# Patient Record
Sex: Female | Born: 1965 | Race: White | Hispanic: No | Marital: Married | Smoking: Current every day smoker
Health system: Southern US, Community
[De-identification: ages and names within clinical notes are randomized; demographics above are authoritative.]

## PROBLEM LIST (undated history)

## (undated) DIAGNOSIS — J449 Chronic obstructive pulmonary disease, unspecified: Secondary | ICD-10-CM

## (undated) DIAGNOSIS — Q600 Renal agenesis, unilateral: Secondary | ICD-10-CM

## (undated) HISTORY — PX: JOINT REPLACEMENT: SHX530

## (undated) HISTORY — PX: ABDOMINAL HYSTERECTOMY: SHX81

## (undated) HISTORY — PX: CHOLECYSTECTOMY: SHX55

---

## 2005-08-16 ENCOUNTER — Emergency Department: Payer: Self-pay | Admitting: Emergency Medicine

## 2005-12-08 ENCOUNTER — Ambulatory Visit: Payer: Self-pay | Admitting: *Deleted

## 2006-07-05 ENCOUNTER — Emergency Department: Payer: Self-pay | Admitting: Emergency Medicine

## 2006-08-02 ENCOUNTER — Ambulatory Visit: Payer: Self-pay | Admitting: *Deleted

## 2007-01-05 ENCOUNTER — Other Ambulatory Visit: Payer: Self-pay

## 2007-01-05 ENCOUNTER — Emergency Department: Payer: Self-pay | Admitting: Emergency Medicine

## 2007-01-11 ENCOUNTER — Other Ambulatory Visit: Payer: Self-pay

## 2007-01-11 ENCOUNTER — Emergency Department: Payer: Self-pay | Admitting: Emergency Medicine

## 2007-01-14 ENCOUNTER — Ambulatory Visit: Payer: Self-pay | Admitting: *Deleted

## 2007-01-19 ENCOUNTER — Ambulatory Visit: Payer: Self-pay | Admitting: *Deleted

## 2007-06-11 ENCOUNTER — Emergency Department: Payer: Self-pay | Admitting: Emergency Medicine

## 2007-11-10 IMAGING — RF DG UGI W/ KUB
1 series · 15 of 15 positions shown · non-contrast
Comparison: none

REASON FOR EXAM: Epigastric pain, difficulty swallowing, evaluate
esophagus, rule out ulcer
COMMENTS:

[Series 1: run · 15 of 15 slices shown]
[im 1/15]
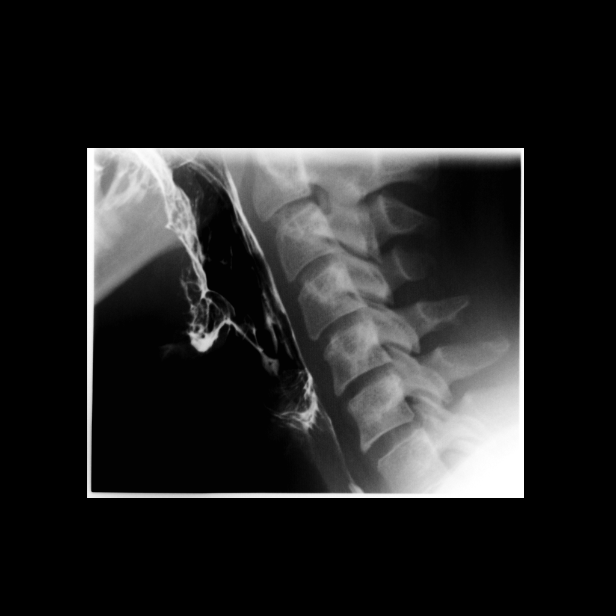
[im 2/15]
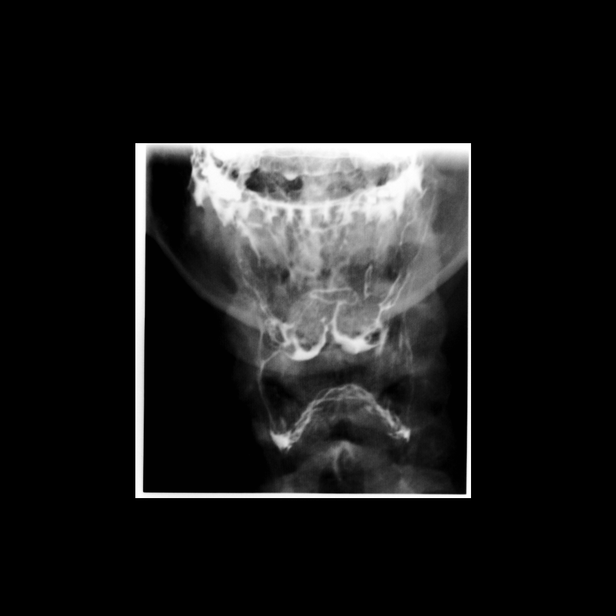
[im 3/15]
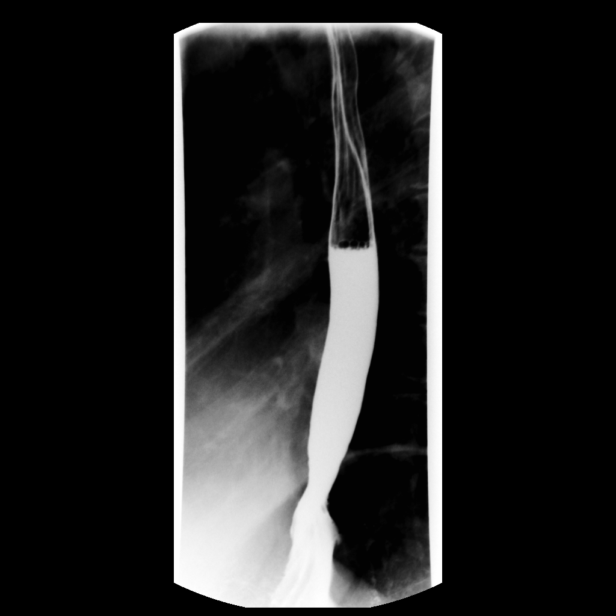
[im 4/15]
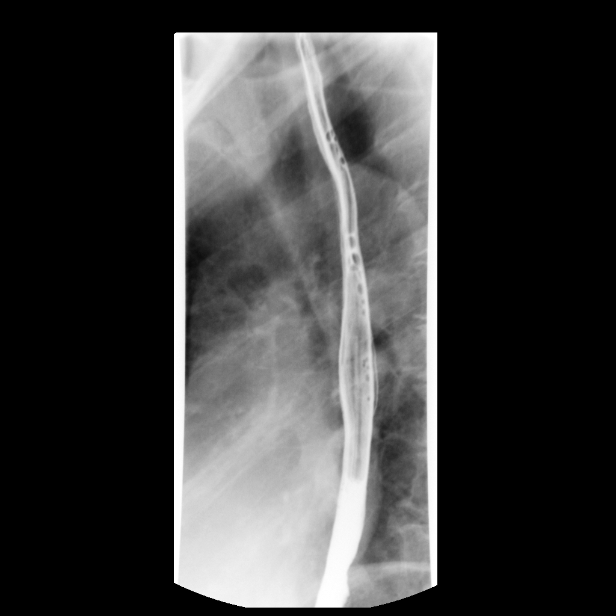
[im 5/15]
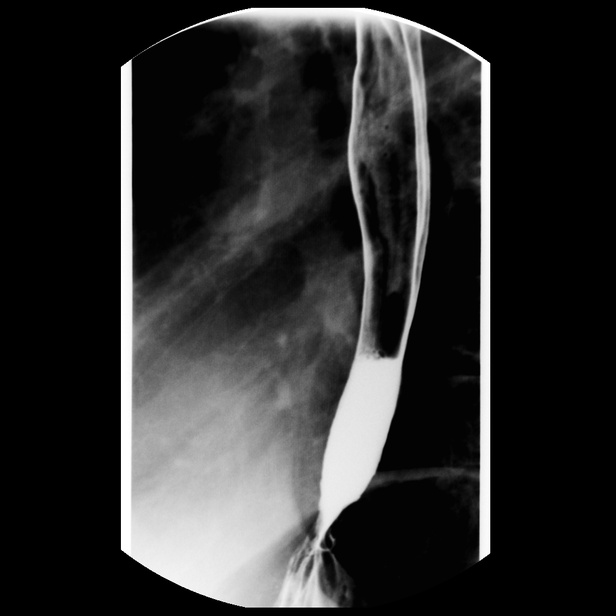
[im 6/15]
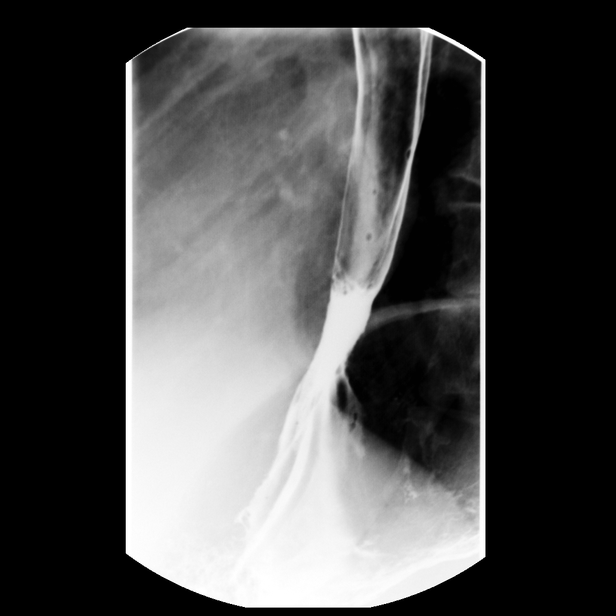
[im 7/15]
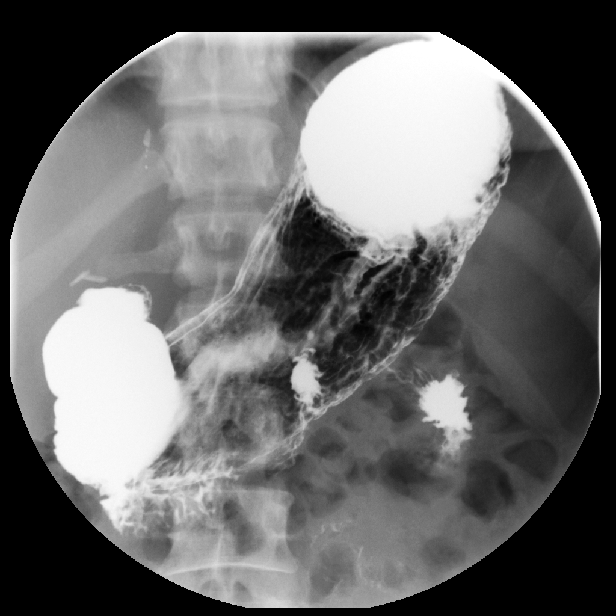
[im 8/15]
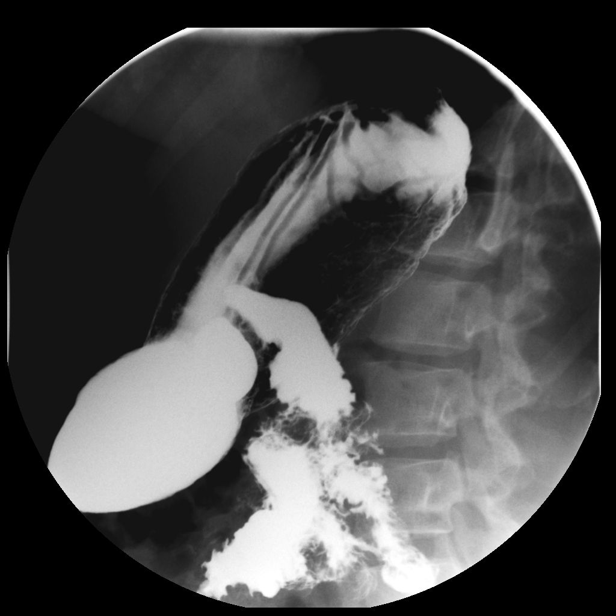
[im 9/15]
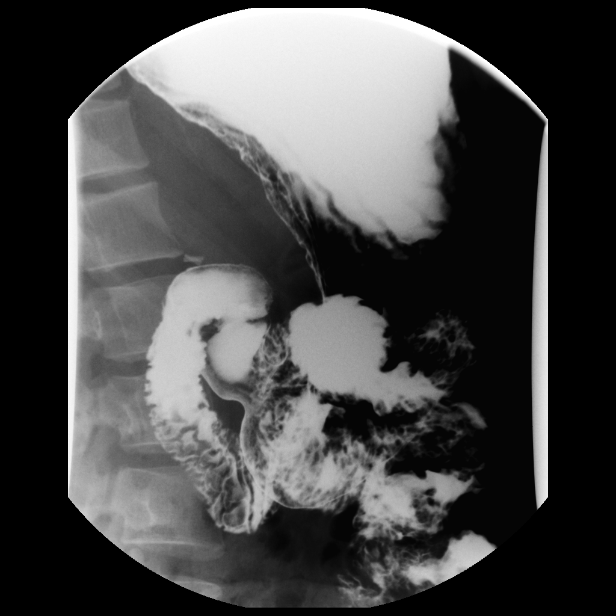
[im 10/15]
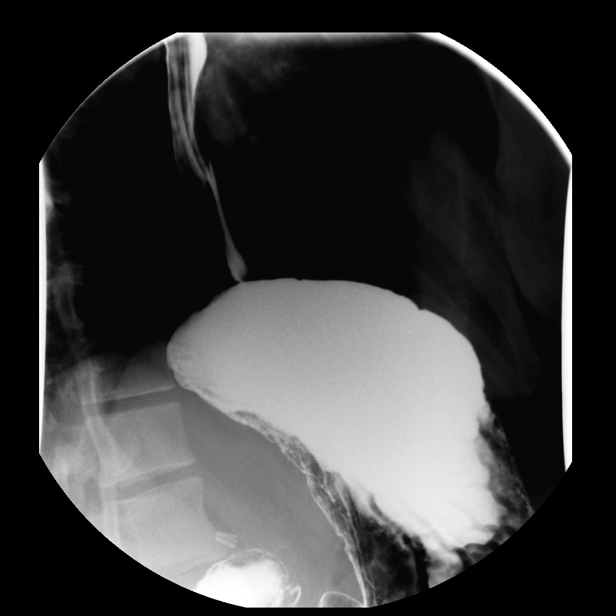
[im 11/15]
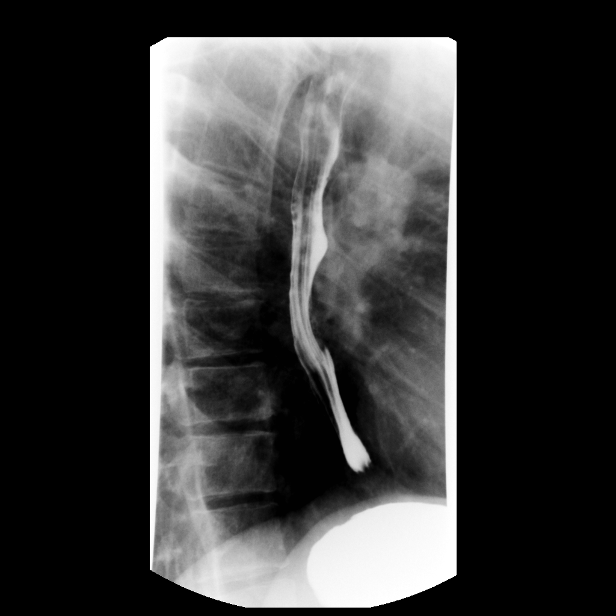
[im 12/15]
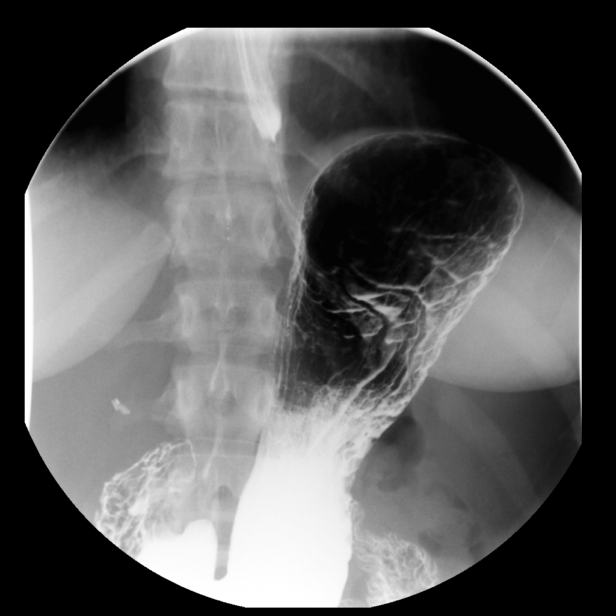
[im 13/15]
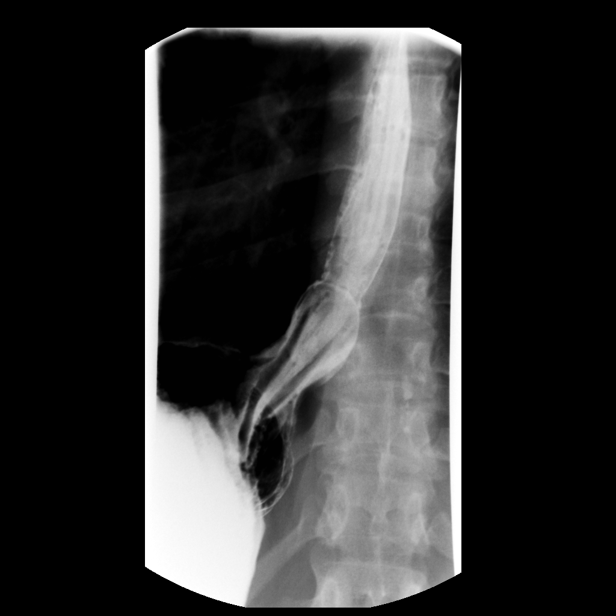
[im 14/15]
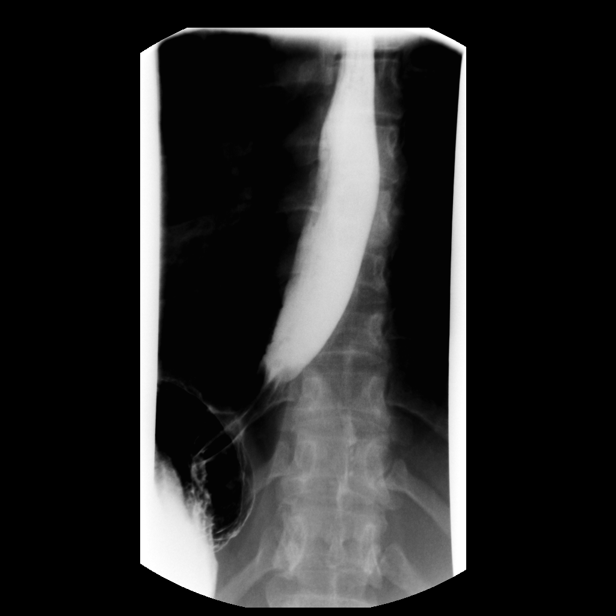
[im 15/15]
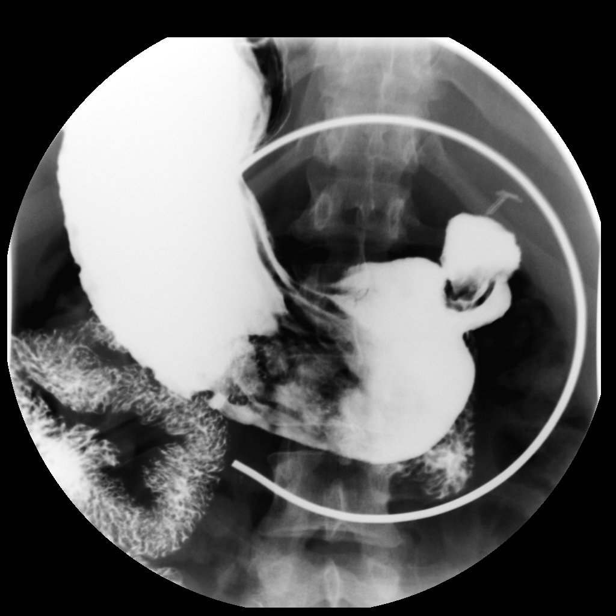

[15 of 15 positions shown; findings below may reference images not displayed]

PROCEDURE:  UPPER GI W/BARIUM SWALLOW  -  January 19, 2007 [DATE]

RESULT:     The patient reports epigastric discomfort as well as difficulty
swallowing.

The cervical esophagus distended well. There was no laryngeal penetration of
the barium. The thoracic esophagus also distended well. There was a moderate
amount of gastroesophageal reflux seen which did occur spontaneously. No
hiatal hernia was demonstrated. The stomach distended well. Gastric emptying
appeared normal. The gastric and duodenal mucosal patterns were normal in
appearance. The 12.0 mm, barium pill passed without difficulty.
IMPRESSION: 1.  I do not see acute abnormality of the cervical or thoracic esophagus. A
very subtle, reducible, hiatal hernia may be present. There is a moderate
amount of spontaneous gastroesophageal reflux without esophagitis. The
patient did note that she experienced some discomfort in the cervical region
with swallowing maneuvers. Direct visualization may be of value.
2.  The stomach and duodenum are normal in appearance. The 12.0 mm barium
pill passed without difficulty.

## 2008-03-21 ENCOUNTER — Other Ambulatory Visit: Payer: Self-pay

## 2008-03-21 ENCOUNTER — Emergency Department: Payer: Self-pay | Admitting: Emergency Medicine

## 2008-07-24 ENCOUNTER — Other Ambulatory Visit: Payer: Self-pay

## 2008-07-24 ENCOUNTER — Emergency Department: Payer: Self-pay | Admitting: Internal Medicine

## 2008-08-02 ENCOUNTER — Ambulatory Visit: Payer: Self-pay | Admitting: Gastroenterology

## 2008-09-03 ENCOUNTER — Emergency Department: Payer: Self-pay | Admitting: Emergency Medicine

## 2009-01-21 ENCOUNTER — Ambulatory Visit: Payer: Self-pay | Admitting: Family Medicine

## 2009-07-04 ENCOUNTER — Ambulatory Visit: Payer: Self-pay | Admitting: Family Medicine

## 2009-07-10 ENCOUNTER — Emergency Department: Payer: Self-pay | Admitting: Emergency Medicine

## 2009-07-12 ENCOUNTER — Ambulatory Visit: Payer: Self-pay | Admitting: Family Medicine

## 2009-12-23 ENCOUNTER — Ambulatory Visit: Payer: Self-pay | Admitting: Family Medicine

## 2010-10-16 ENCOUNTER — Emergency Department: Payer: Self-pay | Admitting: Emergency Medicine

## 2011-04-09 ENCOUNTER — Emergency Department: Payer: Self-pay | Admitting: Emergency Medicine

## 2011-05-19 ENCOUNTER — Emergency Department: Payer: Self-pay | Admitting: Unknown Physician Specialty

## 2011-08-03 ENCOUNTER — Emergency Department: Payer: Self-pay | Admitting: *Deleted

## 2011-09-04 ENCOUNTER — Ambulatory Visit: Payer: Self-pay | Admitting: Family Medicine

## 2011-09-20 ENCOUNTER — Emergency Department: Payer: Self-pay | Admitting: Internal Medicine

## 2012-03-01 ENCOUNTER — Emergency Department: Payer: Self-pay | Admitting: Emergency Medicine

## 2012-03-22 ENCOUNTER — Emergency Department: Payer: Self-pay | Admitting: Emergency Medicine

## 2012-08-20 ENCOUNTER — Emergency Department: Payer: Self-pay | Admitting: Emergency Medicine

## 2012-10-16 ENCOUNTER — Emergency Department: Payer: Self-pay | Admitting: Emergency Medicine

## 2012-10-16 LAB — BASIC METABOLIC PANEL
Anion Gap: 8 (ref 7–16)
Calcium, Total: 9.2 mg/dL (ref 8.5–10.1)
Chloride: 105 mmol/L (ref 98–107)
Co2: 24 mmol/L (ref 21–32)
Osmolality: 270 (ref 275–301)
Sodium: 137 mmol/L (ref 136–145)

## 2012-10-16 LAB — CBC
HGB: 14.1 g/dL (ref 12.0–16.0)
MCV: 94 fL (ref 80–100)
Platelet: 279 10*3/uL (ref 150–440)
RDW: 13.5 % (ref 11.5–14.5)
WBC: 4.9 10*3/uL (ref 3.6–11.0)

## 2012-10-16 LAB — TROPONIN I: Troponin-I: <0.02 ng/mL

## 2013-11-01 ENCOUNTER — Emergency Department: Payer: Self-pay | Admitting: Emergency Medicine

## 2013-11-11 ENCOUNTER — Emergency Department: Payer: Self-pay | Admitting: Emergency Medicine

## 2014-05-18 ENCOUNTER — Emergency Department: Payer: Self-pay | Admitting: Emergency Medicine

## 2014-06-06 ENCOUNTER — Emergency Department: Payer: Self-pay | Admitting: Emergency Medicine

## 2015-02-01 ENCOUNTER — Ambulatory Visit: Payer: Self-pay

## 2015-03-28 IMAGING — US US EXTREM NON VASC*R* COMPLETE
1 series · 10 of 10 positions shown · non-contrast
Comparison: CT examination 08/02/2008

CLINICAL DATA: Right groin pain.  Evaluate for hernia.

EXAM:
US EXTREM NON VASC*R* COMPLETE (ULTRASOUND OF THE RIGHT GROIN)
TECHNIQUE: Images were obtained in the right groin.

[Series 1: us extrem non vasc*right* complete · 0.06mm/px · 10 acquisitions, 10 frames shown]
[im 1/10]
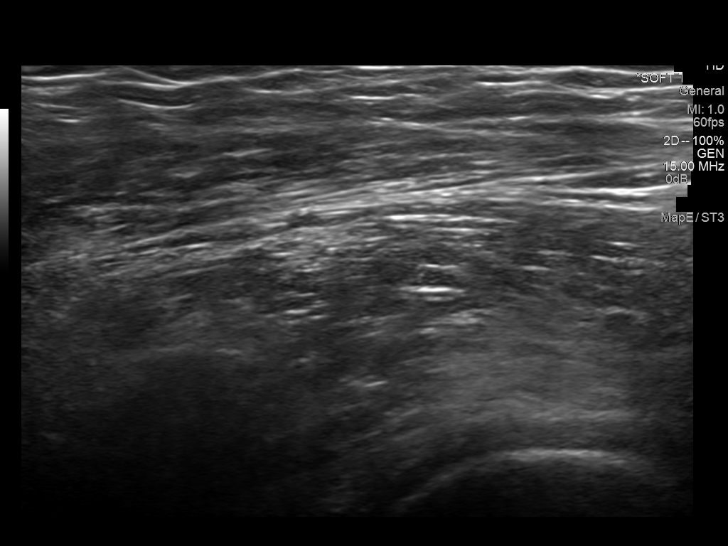
[im 2/10]
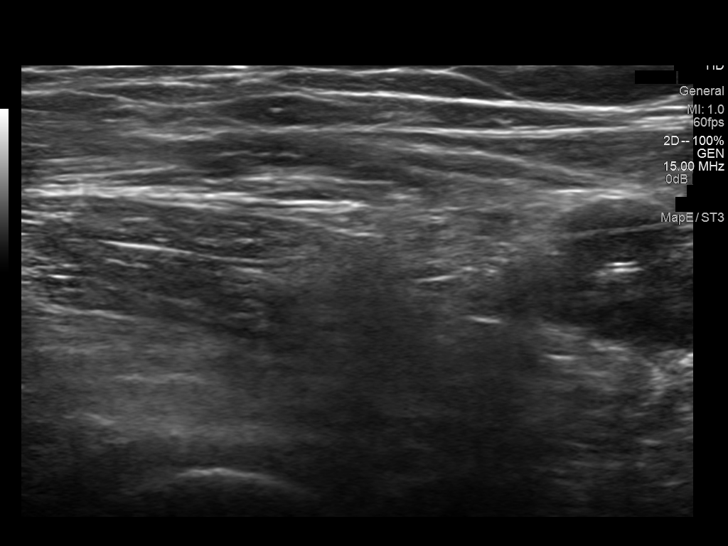
[im 3/10]
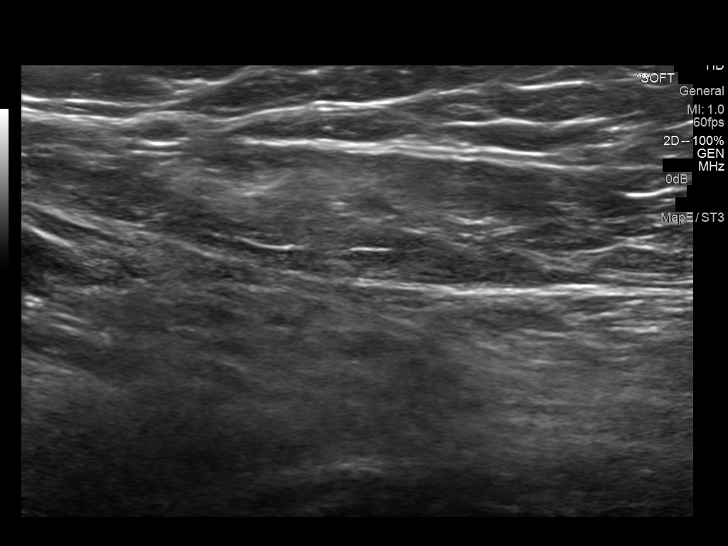
[im 4/10]
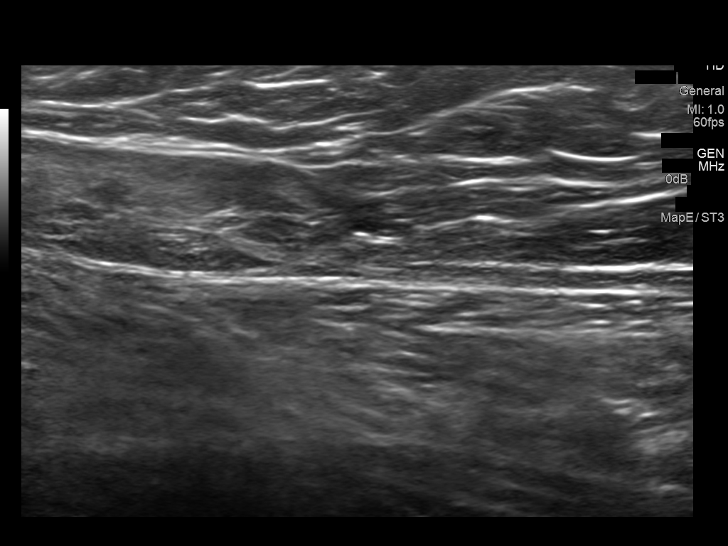
[im 5/10]
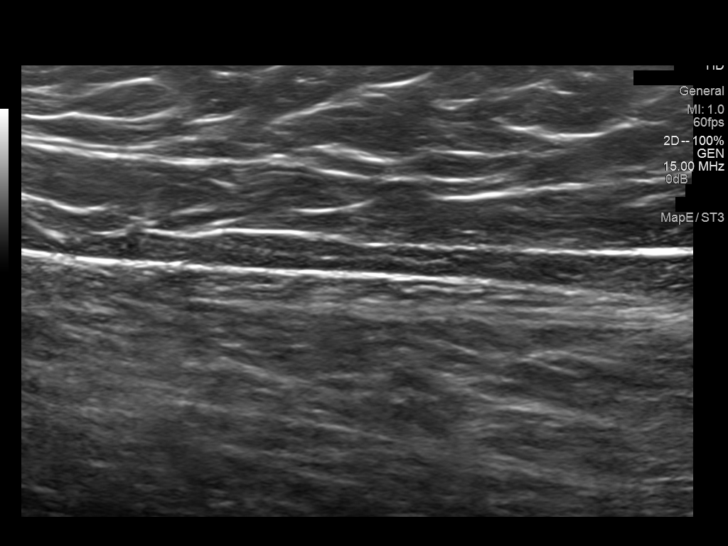
[im 6/10]
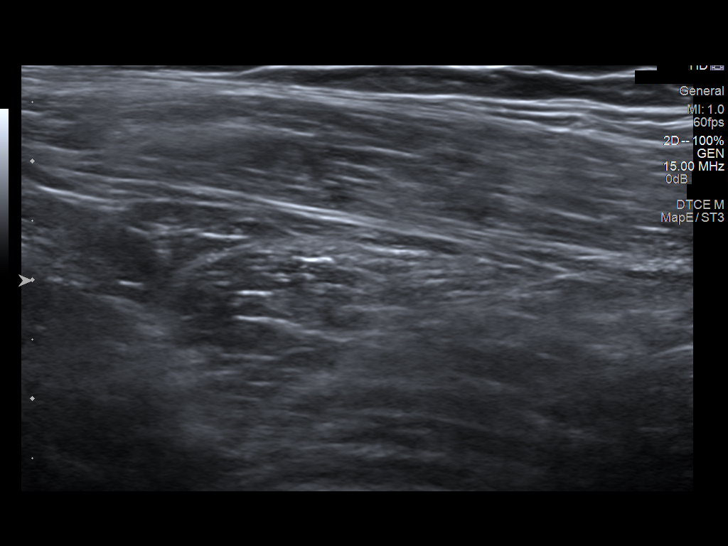
[im 7/10]
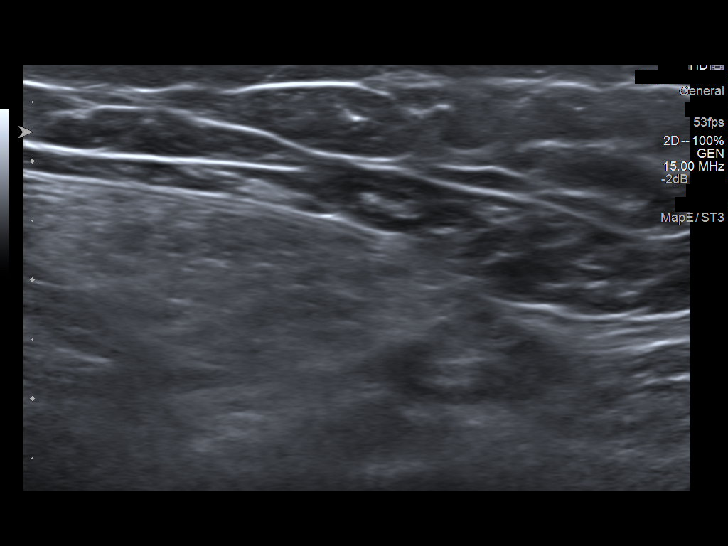
[im 8/10]
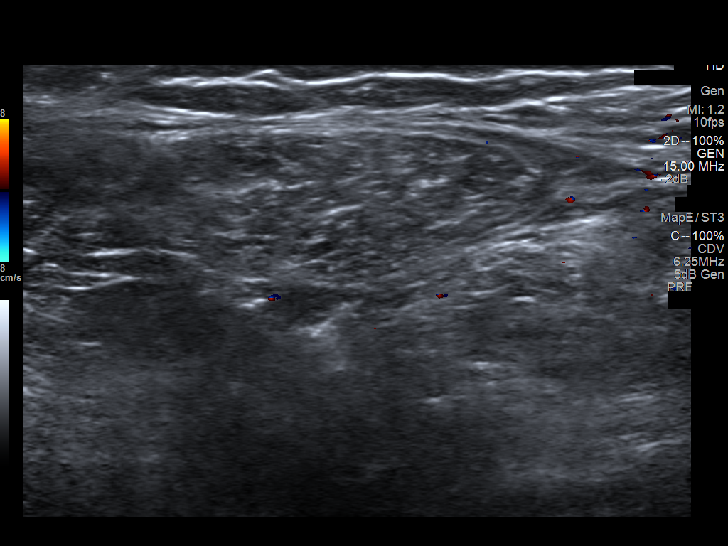
[im 9/10]
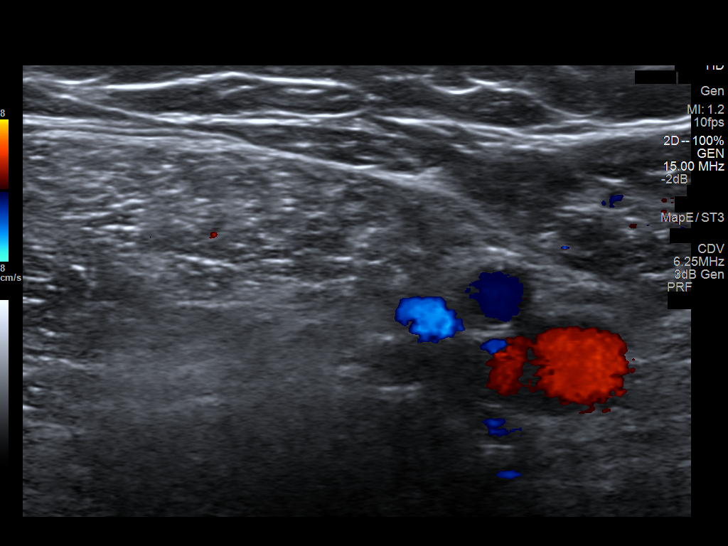
[im 10/10]
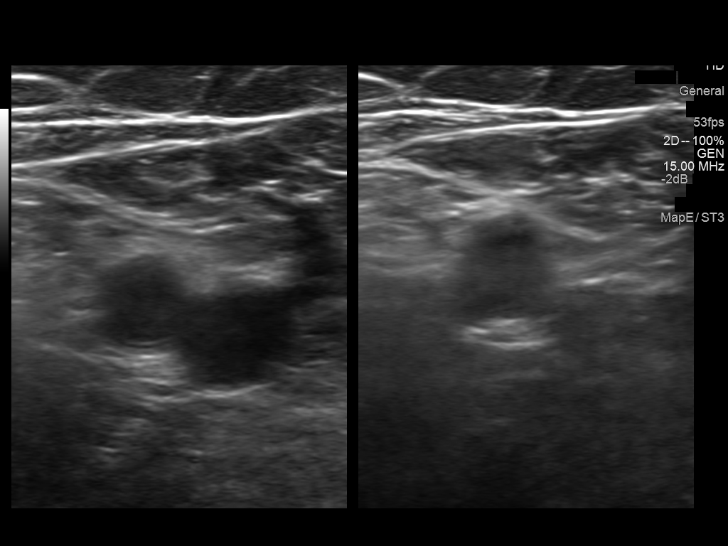

[10 of 10 positions shown; findings below may reference images not displayed]

FINDINGS: No suspicious solid or cystic lesions in the right groin. No
evidence for an inguinal hernia. Vascular structures identified in
the right groin and the right common femoral vein is compressible.
IMPRESSION: Ultrasound images of the right groin are unremarkable.

## 2015-05-18 ENCOUNTER — Emergency Department: Payer: Managed Care, Other (non HMO)

## 2015-05-18 ENCOUNTER — Encounter: Payer: Self-pay | Admitting: Emergency Medicine

## 2015-05-18 ENCOUNTER — Other Ambulatory Visit: Payer: Self-pay

## 2015-05-18 ENCOUNTER — Emergency Department
Admission: EM | Admit: 2015-05-18 | Discharge: 2015-05-18 | Disposition: A | Discharge status: Home / Self Care | Payer: Managed Care, Other (non HMO) | Attending: Student | Admitting: Student

## 2015-05-18 DIAGNOSIS — X58XXXA Exposure to other specified factors, initial encounter: Secondary | ICD-10-CM | POA: Insufficient documentation

## 2015-05-18 DIAGNOSIS — I10 Essential (primary) hypertension: Secondary | ICD-10-CM | POA: Diagnosis not present

## 2015-05-18 DIAGNOSIS — Y998 Other external cause status: Secondary | ICD-10-CM | POA: Diagnosis not present

## 2015-05-18 DIAGNOSIS — S233XXA Sprain of ligaments of thoracic spine, initial encounter: Secondary | ICD-10-CM | POA: Diagnosis not present

## 2015-05-18 DIAGNOSIS — M546 Pain in thoracic spine: Secondary | ICD-10-CM | POA: Diagnosis present

## 2015-05-18 DIAGNOSIS — Y9389 Activity, other specified: Secondary | ICD-10-CM | POA: Insufficient documentation

## 2015-05-18 DIAGNOSIS — Z720 Tobacco use: Secondary | ICD-10-CM | POA: Diagnosis not present

## 2015-05-18 DIAGNOSIS — S239XXA Sprain of unspecified parts of thorax, initial encounter: Secondary | ICD-10-CM

## 2015-05-18 DIAGNOSIS — Y9289 Other specified places as the place of occurrence of the external cause: Secondary | ICD-10-CM | POA: Insufficient documentation

## 2015-05-18 LAB — COMPREHENSIVE METABOLIC PANEL
ALBUMIN: 4.4 g/dL (ref 3.5–5.0)
ALT: 23 U/L (ref 14–54)
ANION GAP: 8 (ref 5–15)
AST: 22 U/L (ref 15–41)
Alkaline Phosphatase: 102 U/L (ref 38–126)
BILIRUBIN TOTAL: 0.6 mg/dL (ref 0.3–1.2)
BUN: 10 mg/dL (ref 6–20)
CALCIUM: 9.5 mg/dL (ref 8.9–10.3)
CHLORIDE: 103 mmol/L (ref 101–111)
CO2: 29 mmol/L (ref 22–32)
CREATININE: 1.12 mg/dL — AB (ref 0.44–1.00)
GFR calc Af Amer: >60 mL/min (ref >60)
GFR calc non Af Amer: 57 mL/min — ABNORMAL LOW (ref >60)
Glucose, Bld: 108 mg/dL — ABNORMAL HIGH (ref 65–99)
Potassium: 3.6 mmol/L (ref 3.5–5.1)
Sodium: 140 mmol/L (ref 135–145)
Total Protein: 7.4 g/dL (ref 6.5–8.1)

## 2015-05-18 LAB — CBC
HCT: 41.3 % (ref 35.0–47.0)
HEMOGLOBIN: 14.4 g/dL (ref 12.0–16.0)
MCH: 32.3 pg (ref 26.0–34.0)
MCHC: 34.9 g/dL (ref 32.0–36.0)
MCV: 92.7 fL (ref 80.0–100.0)
Platelets: 202 10*3/uL (ref 150–440)
RBC: 4.45 MIL/uL (ref 3.80–5.20)
RDW: 13 % (ref 11.5–14.5)
WBC: 6.1 10*3/uL (ref 3.6–11.0)

## 2015-05-18 LAB — TROPONIN I: Troponin I: <0.03 ng/mL (ref <0.031)

## 2015-05-18 NOTE — ED Notes (Signed)
Pt in NAD. IV removed. D/c instructions reviewed and acknowledged. Ambulated out of department without assistance.

## 2015-05-18 NOTE — ED Notes (Signed)
Patient transported to X-ray 

## 2015-05-18 NOTE — ED Provider Notes (Signed)
[facility] Emergency Department Provider Note  ____________________________________________  Time seen: Approximately 8:33 AM  I have reviewed the triage vital signs and the nursing notes.   HISTORY  Chief Complaint Chest Pain    HPI Melissa Lynn is a 49 y.o. female with history of hypertension, chronic back pain presents for evaluation of constant left upper back pain since yesterday, worse with movement and cough but not necessarily deep inspiration. She described the pain as stabbing. Gradual onset. Not associated with any chest pain or difficulty breathing. No recent trauma/injury. Current severity is mild. No recent illness including no cough, sneezing, runny nose, congestion, vomiting, diarrhea, fevers or chills. No personal or family history of PE or DVT. No history of coronary artery disease. No bowel or bladder incontinence, numbness or weakness, history of IV drug use, fevers, history of malignancy.   History reviewed. No pertinent past medical history.  There are no active problems to display for this patient.   Past Surgical History  Procedure Laterality Date  . Abdominal hysterectomy    . Joint replacement    . Cholecystectomy      No current outpatient prescriptions on file.  Allergies Prednisone and Septra  No family history on file.  Social History History  Substance Use Topics  . Smoking status: Current Every Day Smoker -- 1.00 packs/day    Types: Cigarettes  . Smokeless tobacco: Not on file  . Alcohol Use: No    Review of Systems Constitutional: No fever/chills Eyes: No visual changes. ENT: No sore throat. Cardiovascular: Denies chest pain. Respiratory: Denies shortness of breath. Gastrointestinal: No abdominal pain.  No nausea, no vomiting.  No diarrhea.  No constipation. Genitourinary: Negative for dysuria. Musculoskeletal: Positive for back pain. Skin: Negative for rash. Neurological: Negative for headaches,  focal weakness or numbness.  10-point ROS otherwise negative.  ____________________________________________   PHYSICAL EXAM:  VITAL SIGNS: ED Triage Vitals  Enc Vitals Group     BP 05/18/15 0734 137/87 mmHg     Pulse Rate 05/18/15 0734 89     Resp 05/18/15 0734 20     Temp 05/18/15 0734 97.8 F (36.6 C)     Temp Source 05/18/15 0734 Oral     SpO2 05/18/15 0734 100 %     Weight 05/18/15 0734 145 lb (65.772 kg)     Height 05/18/15 0734 5\' 4"  (1.626 m)     Head Cir --      Peak Flow --      Pain Score --      Pain Loc --      Pain Edu? --      Excl. in GC? --     Constitutional: Alert and oriented. Well appearing and in no acute distress. Eyes: Conjunctivae are normal. PERRL. EOMI. Head: Atraumatic. Nose: No congestion/rhinnorhea. Mouth/Throat: Mucous membranes are moist.  Oropharynx non-erythematous. Neck: No stridor.  Cardiovascular: Normal rate, regular rhythm. Grossly normal heart sounds.  Good peripheral circulation. Respiratory: Normal respiratory effort.  No retractions. Lungs CTAB. Gastrointestinal: Soft and nontender. No distention. No abdominal bruits. No CVA tenderness. Genitourinary: deferred Musculoskeletal: No lower extremity tenderness nor edema.  No joint effusions. Tenderness to palpation throughout the left paravertebral muscles of the T-spine, pain in the left upper back is reproducible/exacerbated by rotation of the torso to the left and the right. No midline tenderness to palpation. Neurologic:  Normal speech and language. No gross focal neurologic deficits are appreciated. No gait instability. Skin:  Skin is warm, dry  and intact. No rash noted. Psychiatric: Mood and affect are normal. Speech and behavior are normal.  ____________________________________________   LABS (all labs ordered are listed, but only abnormal results are displayed)  Labs Reviewed  COMPREHENSIVE METABOLIC PANEL - Abnormal; Notable for the following:    Glucose, Bld 108 (*)     Creatinine, Ser 1.12 (*)    GFR calc non Af Amer 57 (*)    All other components within normal limits  CBC  TROPONIN I   ____________________________________________  EKG  ED ECG REPORT I, Gayla Doss, the attending physician, personally viewed and interpreted this ECG.   Date: 05/18/2015  EKG Time: 07:42  Rate: 77  Rhythm: normal sinus rhythm  Axis: normal  Intervals:right bundle branch block, incomplete  ST&T Change: No acute ST segment elevation.  ____________________________________________  RADIOLOGY  CXR  IMPRESSION: No active cardiopulmonary disease.  ____________________________________________   PROCEDURES  Procedure(s) performed: None  Critical Care performed: No  ____________________________________________   INITIAL IMPRESSION / ASSESSMENT AND PLAN / ED COURSE  Pertinent labs & imaging results that were available during my care of the patient were reviewed by me and considered in my medical decision making (see chart for details).  Melissa Lynn is a 49 y.o. female with history of hypertension, chronic back pain presents for evaluation of constant left upper back pain since yesterday. On exam, she is very well-appearing and in no acute distress. She has tenderness to palpation throughout the paravertebral muscles of the left upper T-spine. Her pain is reproducible and exacerbated with movement/rotation of the torso and I suspect thoracic muscle strain. She has no chest pain, no shortness of breath and I doubt atypical ACS as the cause of her symptoms. She is PE RC negative, doubt PE. H&P not consistent with acute aortic dissection. No red flags which would be concerning for cauda equina or epidural abscess.  ----------------------------------------- 8:56 AM on 05/18/2015 -----------------------------------------  Labs reviewed by me and are generally unremarkable with the exception of mild creatinine elevation at 1.12. Most recently  documented creatinine was 0.86 which was drawn 2 years ago. We'll encourage by mouth hydration for possible mild dehydration. I have encouraged her to follow up with her primary care doctor in the coming week for recheck of her creatinine. Troponin negative. Chest x-ray clear. Suspect muscle strain. Discussed return precautions, close PCP follow-up, treatment with anti-inflammatories. She is comfortable with the discharge plan.  ____________________________________________   FINAL CLINICAL IMPRESSION(S) / ED DIAGNOSES  Final diagnoses:  Thoracic back sprain, initial encounter      Gayla Doss, MD 05/18/15 [phone]

## 2015-05-18 NOTE — ED Notes (Signed)
Patient to ED with c/o left side back and rib pain since yesterday. Reports pain is worse with movement and with deep breathing.

## 2015-09-03 ENCOUNTER — Emergency Department
Admission: EM | Admit: 2015-09-03 | Discharge: 2015-09-03 | Disposition: A | Discharge status: Home / Self Care | Payer: Managed Care, Other (non HMO) | Attending: Emergency Medicine | Admitting: Emergency Medicine

## 2015-09-03 ENCOUNTER — Encounter: Payer: Self-pay | Admitting: Emergency Medicine

## 2015-09-03 DIAGNOSIS — N390 Urinary tract infection, site not specified: Secondary | ICD-10-CM | POA: Insufficient documentation

## 2015-09-03 DIAGNOSIS — F17210 Nicotine dependence, cigarettes, uncomplicated: Secondary | ICD-10-CM | POA: Insufficient documentation

## 2015-09-03 DIAGNOSIS — R1030 Lower abdominal pain, unspecified: Secondary | ICD-10-CM | POA: Diagnosis present

## 2015-09-03 HISTORY — DX: Chronic obstructive pulmonary disease, unspecified: J44.9

## 2015-09-03 HISTORY — DX: Renal agenesis, unilateral: Q60.0

## 2015-09-03 LAB — URINALYSIS COMPLETE WITH MICROSCOPIC (ARMC ONLY)
Bilirubin Urine: NEGATIVE
Glucose, UA: NEGATIVE mg/dL
Ketones, ur: NEGATIVE mg/dL
Nitrite: NEGATIVE
PH: 7 (ref 5.0–8.0)
PROTEIN: 30 mg/dL — AB
Specific Gravity, Urine: 1.006 (ref 1.005–1.030)

## 2015-09-03 LAB — COMPREHENSIVE METABOLIC PANEL
ALBUMIN: 4.6 g/dL (ref 3.5–5.0)
ALT: 32 U/L (ref 14–54)
ANION GAP: 10 (ref 5–15)
AST: 31 U/L (ref 15–41)
Alkaline Phosphatase: 110 U/L (ref 38–126)
BUN: 7 mg/dL (ref 6–20)
CHLORIDE: 101 mmol/L (ref 101–111)
CO2: 28 mmol/L (ref 22–32)
Calcium: 9.7 mg/dL (ref 8.9–10.3)
Creatinine, Ser: 1.07 mg/dL — ABNORMAL HIGH (ref 0.44–1.00)
GFR calc Af Amer: >60 mL/min (ref >60)
GFR calc non Af Amer: 60 mL/min — ABNORMAL LOW (ref >60)
GLUCOSE: 102 mg/dL — AB (ref 65–99)
POTASSIUM: 3.4 mmol/L — AB (ref 3.5–5.1)
SODIUM: 139 mmol/L (ref 135–145)
Total Bilirubin: 1.3 mg/dL — ABNORMAL HIGH (ref 0.3–1.2)
Total Protein: 7.6 g/dL (ref 6.5–8.1)

## 2015-09-03 LAB — CBC
HCT: 40.6 % (ref 35.0–47.0)
Hemoglobin: 14.3 g/dL (ref 12.0–16.0)
MCH: 32.3 pg (ref 26.0–34.0)
MCHC: 35.1 g/dL (ref 32.0–36.0)
MCV: 91.9 fL (ref 80.0–100.0)
PLATELETS: 185 10*3/uL (ref 150–440)
RBC: 4.42 MIL/uL (ref 3.80–5.20)
RDW: 13.5 % (ref 11.5–14.5)
WBC: 6.1 10*3/uL (ref 3.6–11.0)

## 2015-09-03 MED ORDER — DEXTROSE 5 % IV SOLN
1.0000 g | Freq: Once | INTRAVENOUS | Status: AC
  Administered 2015-09-03: 1 g via INTRAVENOUS

## 2015-09-03 MED ORDER — CEPHALEXIN 500 MG PO CAPS: 500.0000 mg | ORAL_CAPSULE | Freq: Two times a day (BID) | ORAL | Status: AC

## 2015-09-03 MED ORDER — DEXTROSE 5 % IV SOLN
INTRAVENOUS | Status: AC
  Administered 2015-09-03: 1 g via INTRAVENOUS
  Filled 2015-09-03: qty 10

## 2015-09-03 NOTE — Discharge Instructions (Signed)

## 2015-09-03 NOTE — ED Provider Notes (Signed)
[facility] Emergency Department Provider Note  ____________________________________________  Time seen: On arrival  I have reviewed the triage vital signs and the nursing notes.   HISTORY  Chief Complaint Flank Pain and Urinary Retention    HPI Melissa Lynn is a 49 y.o. female who presents with complaints of dysuria and lower abdominal pressure. She reports this is come on gradually over the last day. She does have a history of kidney stones in the past but this feels nothing like them. She has no flank pain. She denies nausea or vomiting. No fevers. 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 She states this feels similar to a urinary tract infection. She has not taken anything for the discomfort     Past Medical History  Diagnosis Date  . Congenital absence of one kidney   . COPD (chronic obstructive pulmonary disease) (HCC)     There are no active problems to display for this patient.   Past Surgical History  Procedure Laterality Date  . Abdominal hysterectomy    . Joint replacement    . Cholecystectomy      Current Outpatient Rx  Name  Route  Sig  Dispense  Refill  . cephALEXin (KEFLEX) 500 MG capsule   Oral   Take 1 capsule (500 mg total) by mouth 2 (two) times daily.   14 capsule   0     Allergies Prednisone and Septra  No family history on file.  Social History Social History  Substance Use Topics  . Smoking status: Current Every Day Smoker -- 1.00 packs/day    Types: Cigarettes  . Smokeless tobacco: None  . Alcohol Use: No    Review of Systems  Constitutional: Negative for fever. Eyes: Negative for visual changes. ENT: Negative for sore throat Cardiovascular: Negative for chest pain. Respiratory: Negative for shortness of breath. Gastrointestinal: Negative for vomiting and diarrhea. Negative for flank pain Genitourinary: Positive for dysuria Musculoskeletal: Negative for back pain. Skin:  Negative for rash. Neurological: Negative for headaches or focal weakness Psychiatric: No anxiety    ____________________________________________   PHYSICAL EXAM:  VITAL SIGNS: ED Triage Vitals  Enc Vitals Group     BP 09/03/15 1146 147/83 mmHg     Pulse Rate 09/03/15 1146 99     Resp 09/03/15 1146 16     Temp 09/03/15 1146 97.4 F (36.3 C)     Temp Source 09/03/15 1146 Oral     SpO2 09/03/15 1146 100 %     Weight 09/03/15 1146 148 lb (67.132 kg)     Height 09/03/15 1146 5\' 4"  (1.626 m)     Head Cir --      Peak Flow --      Pain Score 09/03/15 1146 4     Pain Loc --      Pain Edu? --      Excl. in GC? --      Constitutional: Alert and oriented. Well appearing and in no distress. Pleasant and interactive Eyes: Conjunctivae are normal.  ENT   Head: Normocephalic and atraumatic.   Mouth/Throat: Mucous membranes are moist. Cardiovascular: Normal rate, regular rhythm. Normal and symmetric distal pulses are present in all extremities.  Respiratory: Normal respiratory effort without tachypnea nor retractions.  Gastrointestinal: Soft and non-tender in all quadrants. No distention. There is no CVA tenderness. Genitourinary: deferred Musculoskeletal: Nontender with normal range of motion in all extremities. No lower extremity tenderness nor edema. Neurologic:  Normal speech and  language. No gross focal neurologic deficits are appreciated. Skin:  Skin is warm, dry and intact. No rash noted. Psychiatric: Mood and affect are normal. Patient exhibits appropriate insight and judgment.  ____________________________________________    LABS (pertinent positives/negatives)  Labs Reviewed  COMPREHENSIVE METABOLIC PANEL - Abnormal; Notable for the following:    Potassium 3.4 (*)    Glucose, Bld 102 (*)    Creatinine, Ser 1.07 (*)    Total Bilirubin 1.3 (*)    GFR calc non Af Amer 60 (*)    All other components within normal limits  URINALYSIS COMPLETEWITH MICROSCOPIC  ([facility] ONLY) - Abnormal; Notable for the following:    Color, Urine YELLOW (*)    APPearance CLOUDY (*)    Hgb urine dipstick 3+ (*)    Protein, ur 30 (*)    Leukocytes, UA 3+ (*)    Bacteria, UA FEW (*)    Squamous Epithelial / LPF 0-5 (*)    All other components within normal limits  URINE CULTURE  CBC    ____________________________________________   EKG  None  ____________________________________________    RADIOLOGY I have personally reviewed any xrays that were ordered on this patient: None  ____________________________________________   PROCEDURES  Procedure(s) performed: none  Critical Care performed: none  ____________________________________________   INITIAL IMPRESSION / ASSESSMENT AND PLAN / ED COURSE  Pertinent labs & imaging results that were available during my care of the patient were reviewed by me and considered in my medical decision making (see chart for details).  Patient well-appearing with normal vital signs. Labs are unremarkable. Of note her creatinine is reassuring and her white count is normal. Her urinalysis is consistent with urinary tract infection. I have no reason to suspect kidney stone given no flank pain. We'll give IV Rocephin in the emergency department given her history of one kidney. We will discharge with Keflex. I have sent a urine culture which will follow-up on. Patient is to return if any worsening of her symptoms fevers chills nausea or vomiting.  ____________________________________________   FINAL CLINICAL IMPRESSION(S) / ED DIAGNOSES  Final diagnoses:  UTI (lower urinary tract infection)     Jene Every, MD 09/03/15 1357

## 2015-09-03 NOTE — ED Notes (Signed)
Pt reports lower back pain since last night, reports blood in urine today. Hx of kidney stones, Pt reports only having left kidney.

## 2015-09-06 LAB — URINE CULTURE: Culture: >=100000

## 2016-03-08 IMAGING — CR DG CHEST 2V
2 series · 2 of 2 positions shown · non-contrast
Comparison: 10/16/2012

CLINICAL DATA: Left-sided back pain since yesterday. Pain is worse
with movement and deep breathing.

EXAM:
CHEST  2 VIEW

[chest pa]
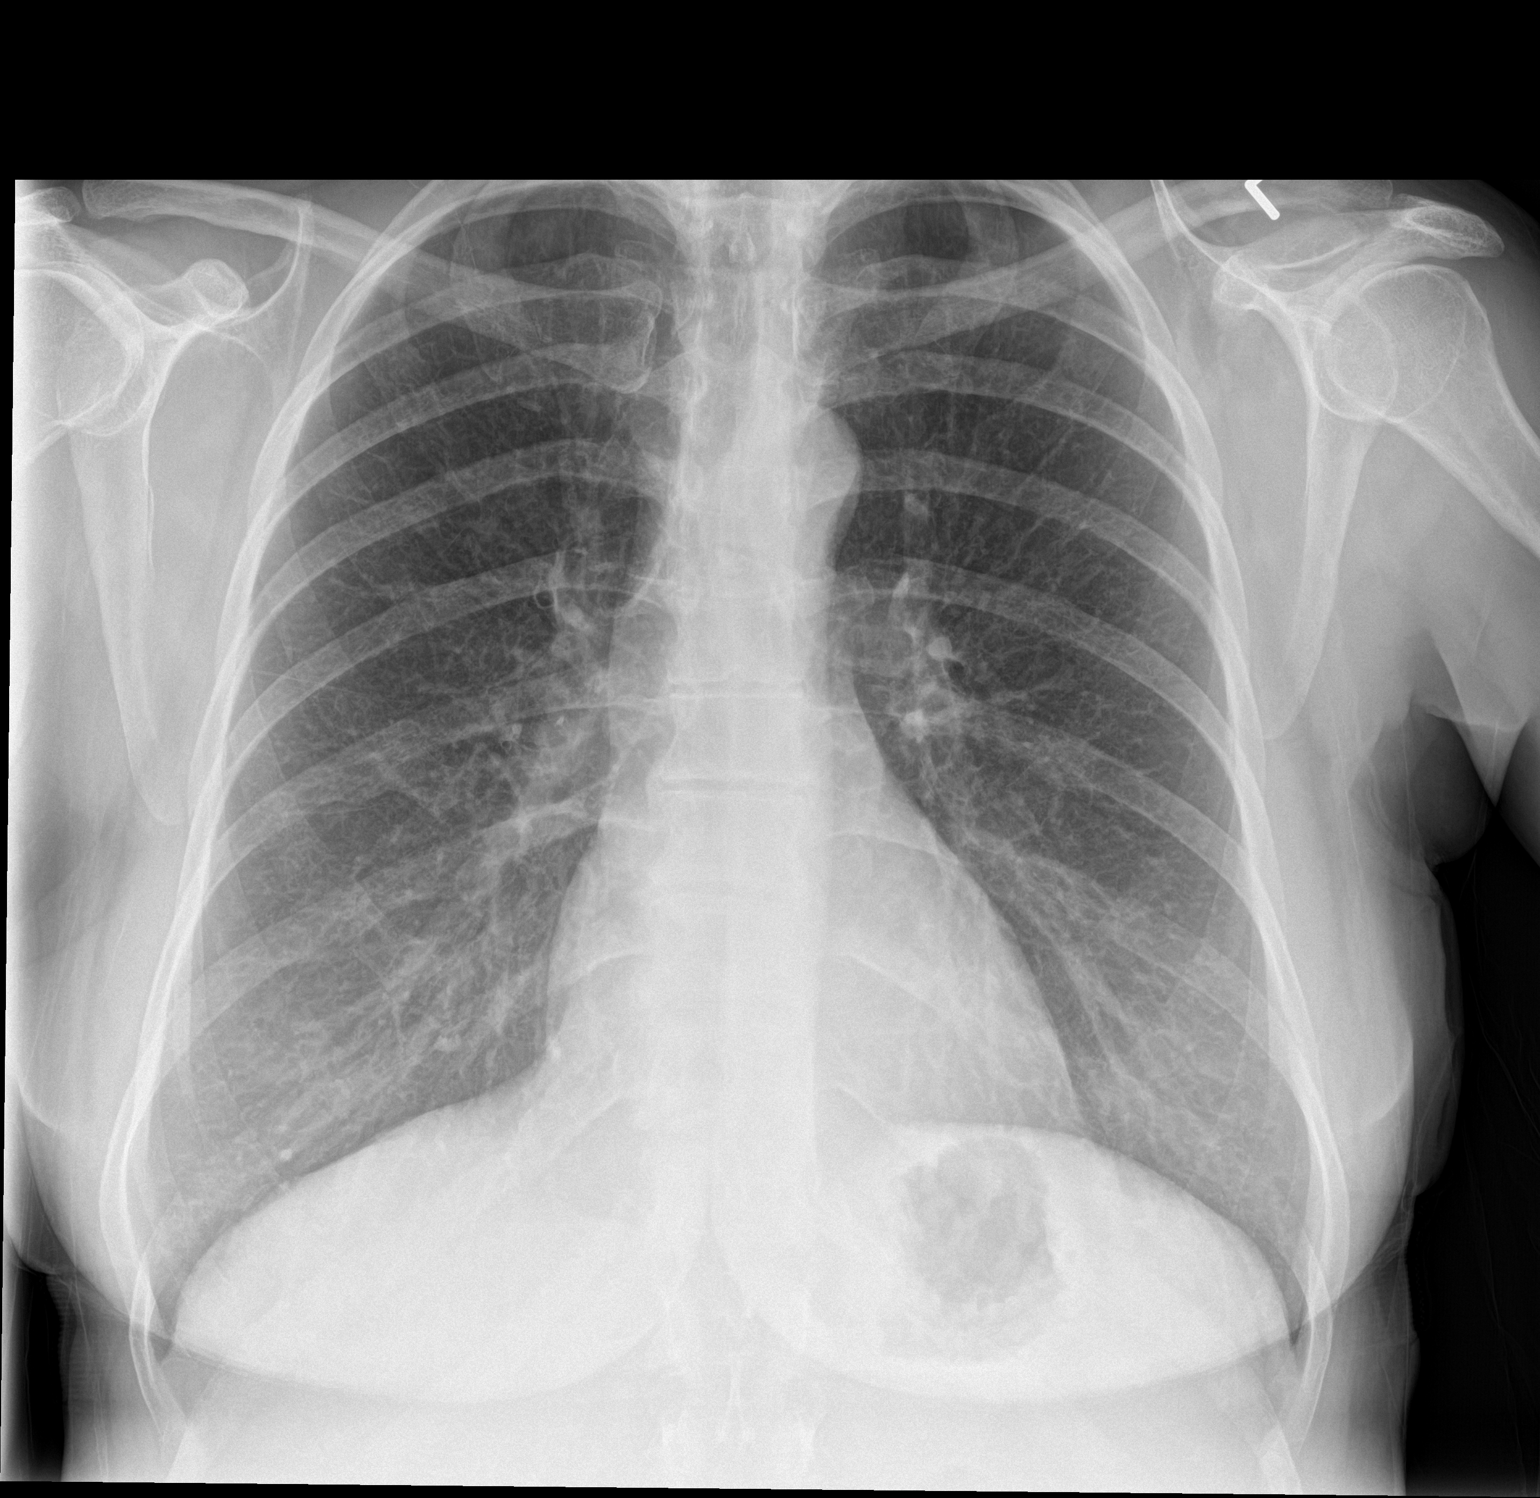

[chest lat]
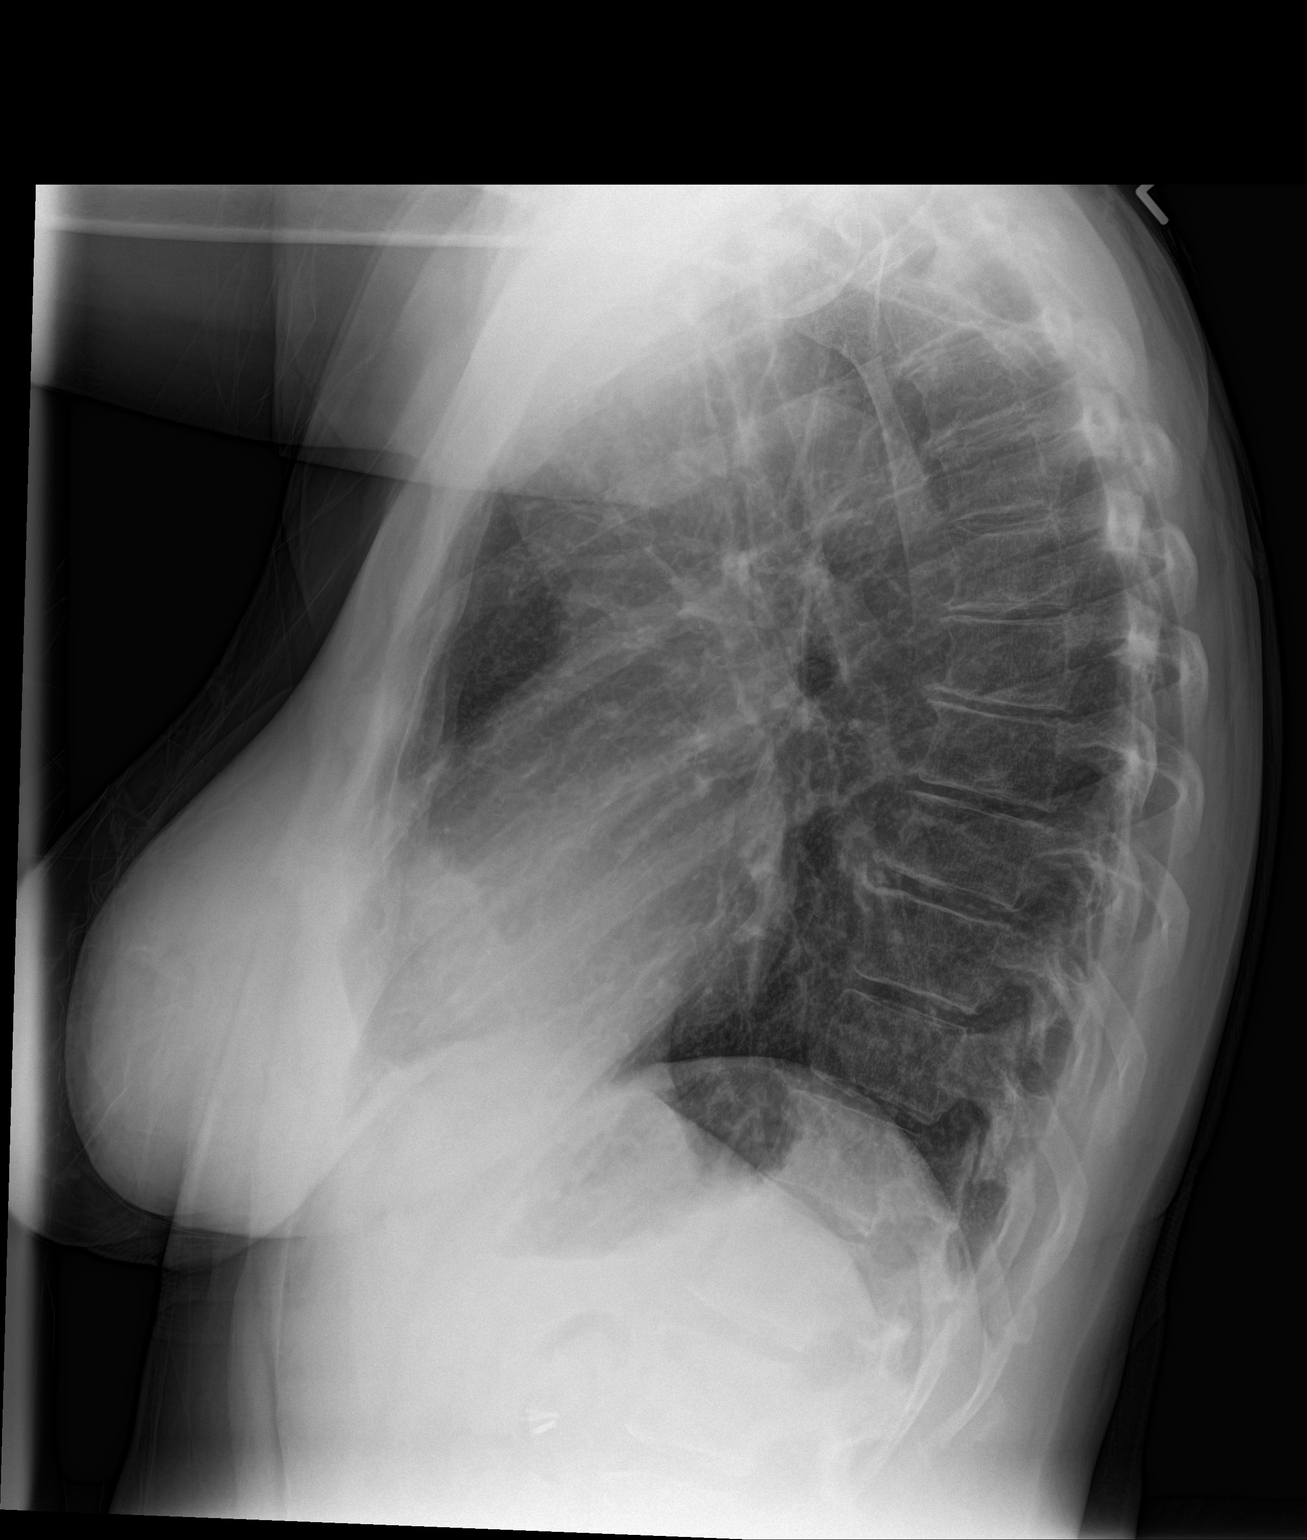

[2 of 2 positions shown; findings below may reference images not displayed]

FINDINGS: The heart size and mediastinal contours are within normal limits.
Both lungs are clear. The visualized skeletal structures are
unremarkable. No pneumothorax. Surgical clips are noted in the upper
abdomen.
IMPRESSION: No active cardiopulmonary disease.
